# Patient Record
Sex: Female | Born: 1945
Health system: Southern US, Community
[De-identification: ages and names within clinical notes are randomized; demographics above are authoritative.]

## PROBLEM LIST (undated history)

## (undated) DIAGNOSIS — I10 Essential (primary) hypertension: Secondary | ICD-10-CM

## (undated) HISTORY — DX: Essential (primary) hypertension: I10

---

## 2004-09-23 ENCOUNTER — Ambulatory Visit: Payer: Self-pay | Admitting: Cardiology

## 2004-09-26 ENCOUNTER — Ambulatory Visit: Payer: Self-pay | Admitting: Cardiology

## 2004-10-13 ENCOUNTER — Ambulatory Visit: Payer: Self-pay | Admitting: Cardiology

## 2010-02-07 ENCOUNTER — Encounter
Admission: RE | Admit: 2010-02-07 | Discharge: 2010-02-07 | Payer: Self-pay | Source: Home / Self Care | Attending: Allergy | Admitting: Allergy

## 2012-07-06 IMAGING — CT CT PARANASAL SINUSES LIMITED
1 series · 10 of 12 positions shown, 13 images · non-contrast
Comparison: None

CLINICAL DATA: Cough and wheezing.  Chronic sinusitis.

CT PARANASAL SINUS LIMITED WITHOUT CONTRAST
TECHNIQUE: Multidetector CT images of the paranasal sinuses were
obtained in a single plane without contrast.

[Series 3: coronal soft · axial · 0.38mm/px · z∈[+62,+152]mm · 10 of 12 slices shown, 13 images]
[im 2/12  brain]
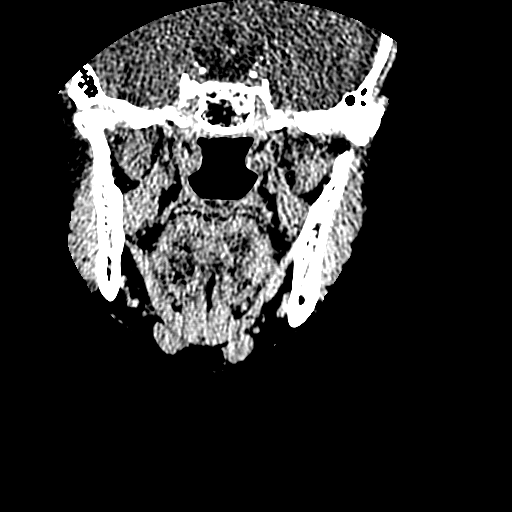
[im 2/12  bone]
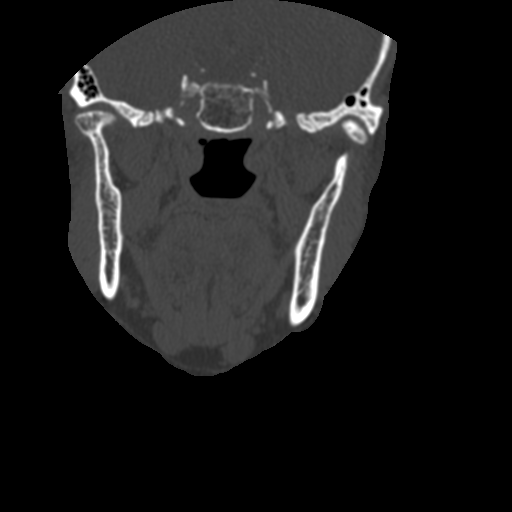
[im 3/12  bone]
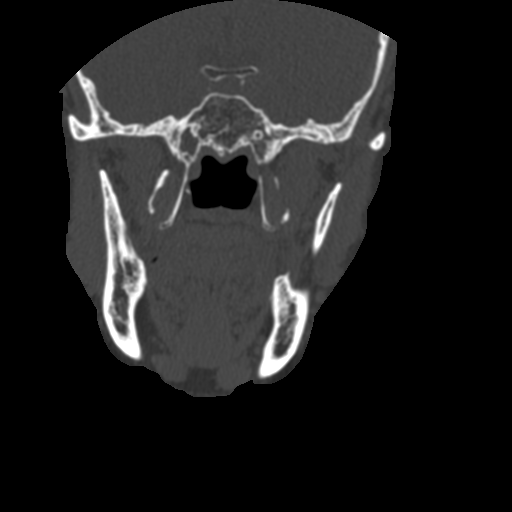
[im 4/12  bone]
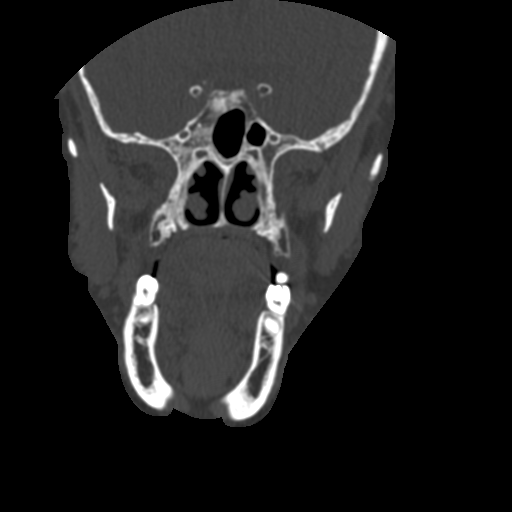
[im 5/12  bone]
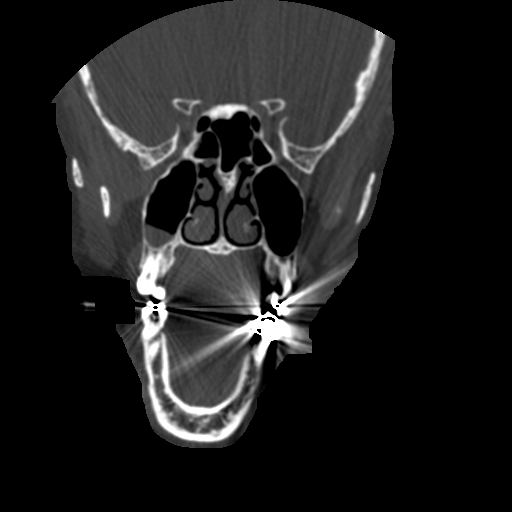
[im 6/12  brain]
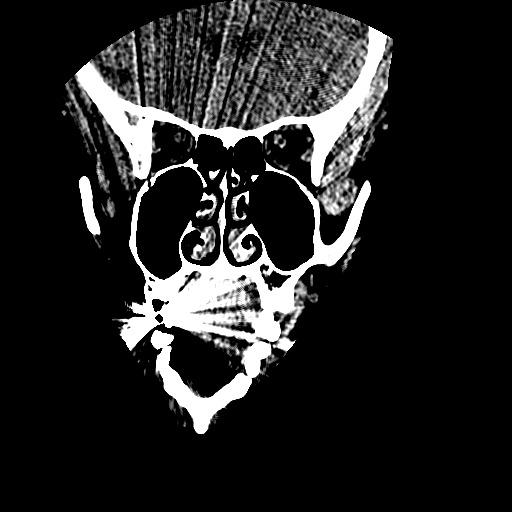
[im 6/12  bone]
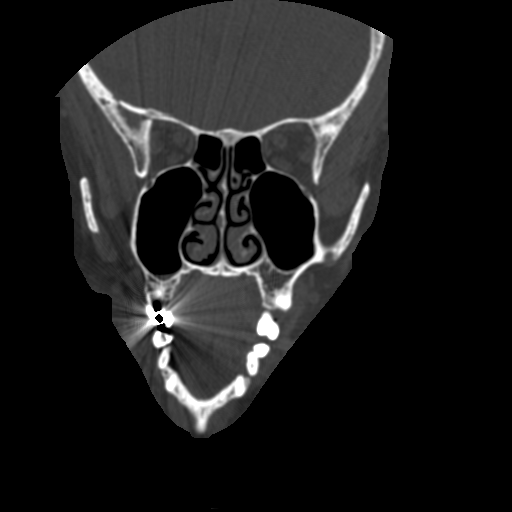
[im 7/12  bone]
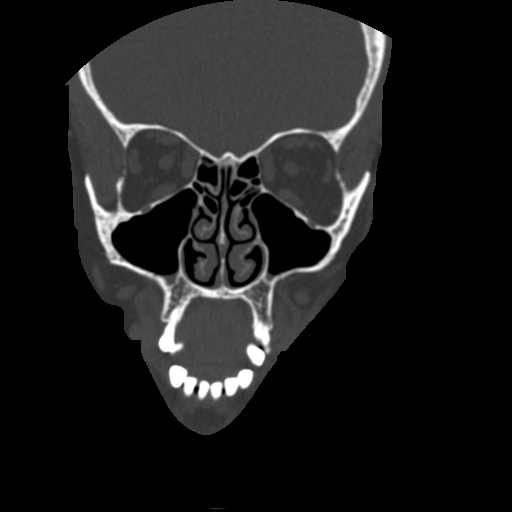
[im 8/12  bone]
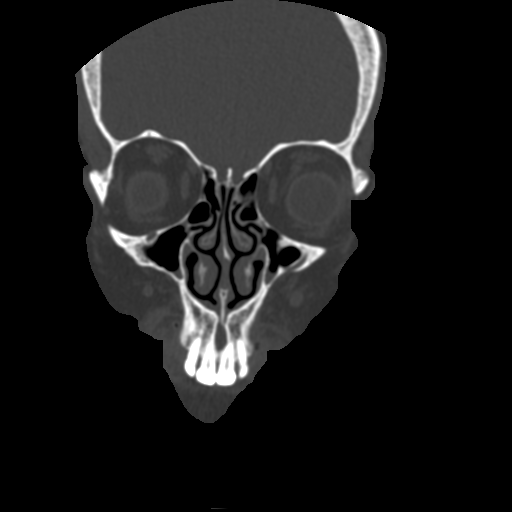
[im 9/12  bone]
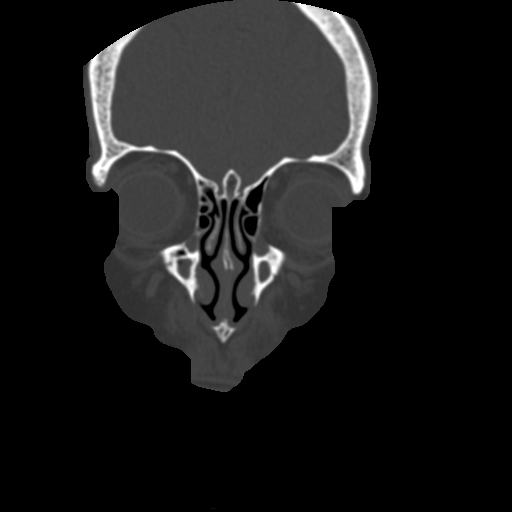
[im 10/12  brain]
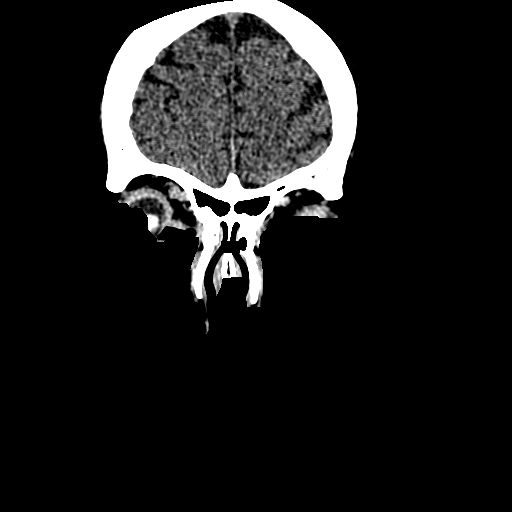
[im 10/12  bone]
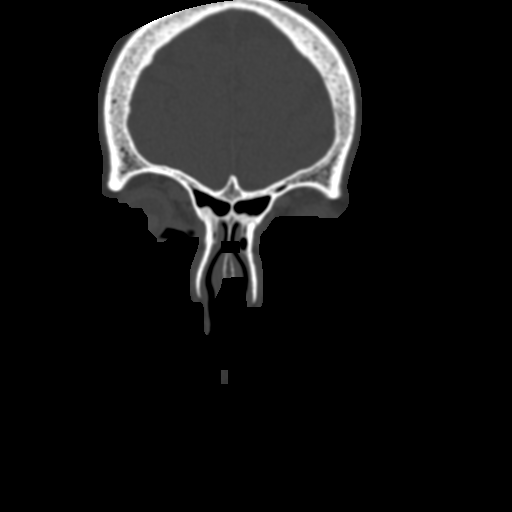
[im 11/12  bone]
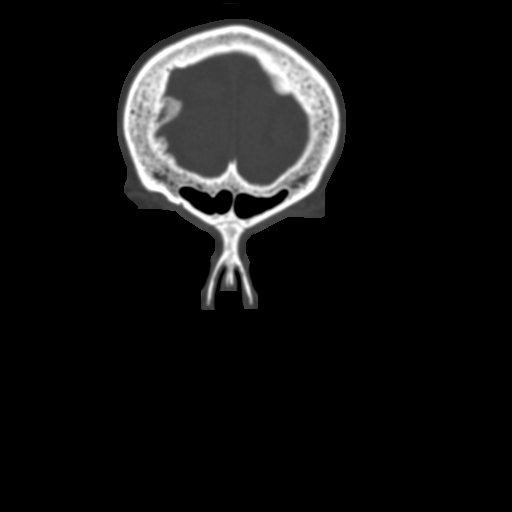

[10 of 12 positions shown; findings below may reference images not displayed]

FINDINGS: There is very mild mucosal thickening involving the
dependent portion of the maxillary sinuses.  This measures up to
4.4 mm.

The sphenoid sinuses, frontal sinuses and ethmoid air cells are
well-aerated.

The nasal septum is midline.
IMPRESSION: 1.  Very mild mucosal thickening involving the maxillary sinuses.

## 2015-02-07 DIAGNOSIS — E042 Nontoxic multinodular goiter: Secondary | ICD-10-CM | POA: Diagnosis not present

## 2015-04-01 DIAGNOSIS — E042 Nontoxic multinodular goiter: Secondary | ICD-10-CM | POA: Diagnosis not present

## 2015-04-05 DIAGNOSIS — Z1231 Encounter for screening mammogram for malignant neoplasm of breast: Secondary | ICD-10-CM | POA: Diagnosis not present

## 2015-10-10 DIAGNOSIS — N390 Urinary tract infection, site not specified: Secondary | ICD-10-CM | POA: Diagnosis not present

## 2015-10-10 DIAGNOSIS — R350 Frequency of micturition: Secondary | ICD-10-CM | POA: Diagnosis not present

## 2015-10-10 DIAGNOSIS — Z90710 Acquired absence of both cervix and uterus: Secondary | ICD-10-CM | POA: Diagnosis not present

## 2015-10-31 DIAGNOSIS — Z87891 Personal history of nicotine dependence: Secondary | ICD-10-CM | POA: Diagnosis not present

## 2015-10-31 DIAGNOSIS — I10 Essential (primary) hypertension: Secondary | ICD-10-CM | POA: Diagnosis not present

## 2015-10-31 DIAGNOSIS — N952 Postmenopausal atrophic vaginitis: Secondary | ICD-10-CM | POA: Diagnosis not present

## 2015-10-31 DIAGNOSIS — Z299 Encounter for prophylactic measures, unspecified: Secondary | ICD-10-CM | POA: Diagnosis not present

## 2015-11-11 DIAGNOSIS — J3089 Other allergic rhinitis: Secondary | ICD-10-CM | POA: Diagnosis not present

## 2015-11-18 DIAGNOSIS — Z6823 Body mass index (BMI) 23.0-23.9, adult: Secondary | ICD-10-CM | POA: Diagnosis not present

## 2015-11-18 DIAGNOSIS — Z713 Dietary counseling and surveillance: Secondary | ICD-10-CM | POA: Diagnosis not present

## 2015-11-18 DIAGNOSIS — Z299 Encounter for prophylactic measures, unspecified: Secondary | ICD-10-CM | POA: Diagnosis not present

## 2015-11-18 DIAGNOSIS — N952 Postmenopausal atrophic vaginitis: Secondary | ICD-10-CM | POA: Diagnosis not present

## 2015-12-18 DIAGNOSIS — J3089 Other allergic rhinitis: Secondary | ICD-10-CM | POA: Diagnosis not present

## 2015-12-18 DIAGNOSIS — J019 Acute sinusitis, unspecified: Secondary | ICD-10-CM | POA: Diagnosis not present

## 2016-01-21 DIAGNOSIS — Z7189 Other specified counseling: Secondary | ICD-10-CM | POA: Diagnosis not present

## 2016-01-21 DIAGNOSIS — Z6825 Body mass index (BMI) 25.0-25.9, adult: Secondary | ICD-10-CM | POA: Diagnosis not present

## 2016-01-21 DIAGNOSIS — Z1211 Encounter for screening for malignant neoplasm of colon: Secondary | ICD-10-CM | POA: Diagnosis not present

## 2016-01-21 DIAGNOSIS — Z299 Encounter for prophylactic measures, unspecified: Secondary | ICD-10-CM | POA: Diagnosis not present

## 2016-01-21 DIAGNOSIS — Z0000 Encounter for general adult medical examination without abnormal findings: Secondary | ICD-10-CM | POA: Diagnosis not present

## 2016-01-21 DIAGNOSIS — Z1389 Encounter for screening for other disorder: Secondary | ICD-10-CM | POA: Diagnosis not present

## 2016-01-22 DIAGNOSIS — F411 Generalized anxiety disorder: Secondary | ICD-10-CM | POA: Diagnosis not present

## 2016-01-22 DIAGNOSIS — E559 Vitamin D deficiency, unspecified: Secondary | ICD-10-CM | POA: Diagnosis not present

## 2016-01-22 DIAGNOSIS — Z79899 Other long term (current) drug therapy: Secondary | ICD-10-CM | POA: Diagnosis not present

## 2016-01-22 DIAGNOSIS — E7800 Pure hypercholesterolemia, unspecified: Secondary | ICD-10-CM | POA: Diagnosis not present

## 2016-03-12 DIAGNOSIS — E2839 Other primary ovarian failure: Secondary | ICD-10-CM | POA: Diagnosis not present

## 2016-04-07 DIAGNOSIS — Z1231 Encounter for screening mammogram for malignant neoplasm of breast: Secondary | ICD-10-CM | POA: Diagnosis not present

## 2016-05-05 DIAGNOSIS — E042 Nontoxic multinodular goiter: Secondary | ICD-10-CM | POA: Diagnosis not present

## 2016-05-20 DIAGNOSIS — Z6824 Body mass index (BMI) 24.0-24.9, adult: Secondary | ICD-10-CM | POA: Diagnosis not present

## 2016-05-20 DIAGNOSIS — Z87891 Personal history of nicotine dependence: Secondary | ICD-10-CM | POA: Diagnosis not present

## 2016-05-20 DIAGNOSIS — E785 Hyperlipidemia, unspecified: Secondary | ICD-10-CM | POA: Diagnosis not present

## 2016-05-20 DIAGNOSIS — I10 Essential (primary) hypertension: Secondary | ICD-10-CM | POA: Diagnosis not present

## 2016-05-20 DIAGNOSIS — Z299 Encounter for prophylactic measures, unspecified: Secondary | ICD-10-CM | POA: Diagnosis not present

## 2016-05-20 DIAGNOSIS — R109 Unspecified abdominal pain: Secondary | ICD-10-CM | POA: Diagnosis not present

## 2016-05-25 DIAGNOSIS — E042 Nontoxic multinodular goiter: Secondary | ICD-10-CM | POA: Diagnosis not present

## 2016-05-28 ENCOUNTER — Encounter (INDEPENDENT_AMBULATORY_CARE_PROVIDER_SITE_OTHER): Payer: Self-pay | Admitting: Internal Medicine

## 2016-05-28 ENCOUNTER — Encounter (INDEPENDENT_AMBULATORY_CARE_PROVIDER_SITE_OTHER): Payer: Self-pay

## 2016-06-10 ENCOUNTER — Encounter (INDEPENDENT_AMBULATORY_CARE_PROVIDER_SITE_OTHER): Payer: Self-pay | Admitting: *Deleted

## 2016-06-10 ENCOUNTER — Ambulatory Visit (INDEPENDENT_AMBULATORY_CARE_PROVIDER_SITE_OTHER): Payer: Medicare Other | Admitting: Internal Medicine

## 2016-06-10 ENCOUNTER — Encounter (INDEPENDENT_AMBULATORY_CARE_PROVIDER_SITE_OTHER): Payer: Self-pay | Admitting: Internal Medicine

## 2016-06-10 ENCOUNTER — Telehealth (INDEPENDENT_AMBULATORY_CARE_PROVIDER_SITE_OTHER): Payer: Self-pay | Admitting: *Deleted

## 2016-06-10 ENCOUNTER — Other Ambulatory Visit (INDEPENDENT_AMBULATORY_CARE_PROVIDER_SITE_OTHER): Payer: Self-pay | Admitting: Internal Medicine

## 2016-06-10 VITALS — BP 172/80 | HR 76 | Temp 98.1°F | Ht 64.5 in | Wt 142.1 lb

## 2016-06-10 DIAGNOSIS — R195 Other fecal abnormalities: Secondary | ICD-10-CM | POA: Diagnosis not present

## 2016-06-10 DIAGNOSIS — I10 Essential (primary) hypertension: Secondary | ICD-10-CM

## 2016-06-10 HISTORY — DX: Essential (primary) hypertension: I10

## 2016-06-10 MED ORDER — PEG 3350-KCL-NA BICARB-NACL 420 G PO SOLR: 4000.0000 mL | Freq: Once | ORAL | 0 refills | Status: AC

## 2016-06-10 NOTE — Progress Notes (Addendum)
   Subjective:    Patient ID: Ashley Sherman, female    DOB: 1945/04/19, 71 y.o.   MRN: [id number]  HPI Referred by Dr. Manuella Ghazi for colonoscopy. She tells me 3 weeks ago she had diarrhea. The diarrhea lasted about 10 days.  No fever associated with her symptoms. She says her stools were very soft. She had a tender area in her abdomen. She was given Cipro x 10 days.  She says her symptoms resolved.  She says she has a lot of mucous from her rectum. Her stools are loose.  Stools are not watery. She has 1-2 stools a day.  Weight has remained the same. Appetite is okay. Her last colonoscopy in [place] in 2014 by Dr. Posey Pronto and was normal.  She does have a hx of colon polyps.  Per Dr. Serita Grit records (adenoma)  Review of Systems Past Medical History:  Diagnosis Date  . Essential hypertension, benign 06/10/2016    No past surgical history on file.  Allergies  Allergen Reactions  . Penicillins     rash  . Sulfa Antibiotics     rash    No current outpatient prescriptions on file prior to visit.   No current facility-administered medications on file prior to visit.        Objective:   Physical Exam Blood pressure (!) 172/80, pulse 76, temperature 98.1 F (36.7 C), height 5' 4.5" (1.638 m), weight 142 lb 1.6 oz (64.5 kg).  Alert and oriented. Skin warm and dry. Oral mucosa is moist.   . Sclera anicteric, conjunctivae is pink. Thyroid not enlarged. No cervical lymphadenopathy. Lungs clear. Heart regular rate and rhythm.  Abdomen is soft. Bowel sounds are positive. No hepatomegaly. No abdominal masses felt. No tenderness.  No edema to lower extremities.  No stool, guaiac negative.       Assessment & Plan:  Change in stool. Colonic neoplasm needs to be ruled out. The risks and benefits such as perforation, bleeding, and infection were reviewed with the patient and is agreeable.

## 2016-06-10 NOTE — Patient Instructions (Signed)
Colonoscopy.  The risks and benefits such as perforation, bleeding, and infection were reviewed with the patient and is agreeable. 

## 2016-06-10 NOTE — Telephone Encounter (Signed)
Patient needs trilyte 

## 2016-06-12 ENCOUNTER — Encounter (INDEPENDENT_AMBULATORY_CARE_PROVIDER_SITE_OTHER): Payer: Self-pay

## 2016-06-17 ENCOUNTER — Ambulatory Visit (INDEPENDENT_AMBULATORY_CARE_PROVIDER_SITE_OTHER): Payer: Self-pay | Admitting: Internal Medicine

## 2016-07-14 DIAGNOSIS — K635 Polyp of colon: Secondary | ICD-10-CM | POA: Diagnosis not present

## 2016-07-14 DIAGNOSIS — E042 Nontoxic multinodular goiter: Secondary | ICD-10-CM | POA: Diagnosis not present

## 2016-07-29 DIAGNOSIS — M9903 Segmental and somatic dysfunction of lumbar region: Secondary | ICD-10-CM | POA: Diagnosis not present

## 2016-07-29 DIAGNOSIS — M791 Myalgia: Secondary | ICD-10-CM | POA: Diagnosis not present

## 2016-07-29 DIAGNOSIS — M9904 Segmental and somatic dysfunction of sacral region: Secondary | ICD-10-CM | POA: Diagnosis not present

## 2016-07-29 DIAGNOSIS — M546 Pain in thoracic spine: Secondary | ICD-10-CM | POA: Diagnosis not present

## 2016-07-29 DIAGNOSIS — M9902 Segmental and somatic dysfunction of thoracic region: Secondary | ICD-10-CM | POA: Diagnosis not present

## 2016-08-07 DIAGNOSIS — J019 Acute sinusitis, unspecified: Secondary | ICD-10-CM | POA: Diagnosis not present

## 2016-08-19 ENCOUNTER — Encounter (INDEPENDENT_AMBULATORY_CARE_PROVIDER_SITE_OTHER): Payer: Self-pay | Admitting: Internal Medicine

## 2016-08-19 ENCOUNTER — Encounter (INDEPENDENT_AMBULATORY_CARE_PROVIDER_SITE_OTHER): Payer: Self-pay

## 2016-08-19 ENCOUNTER — Ambulatory Visit (INDEPENDENT_AMBULATORY_CARE_PROVIDER_SITE_OTHER): Payer: Medicare Other | Admitting: Internal Medicine

## 2016-08-19 VITALS — BP 120/70 | HR 76 | Temp 98.6°F | Resp 18 | Ht 64.5 in | Wt 134.7 lb

## 2016-08-19 DIAGNOSIS — R195 Other fecal abnormalities: Secondary | ICD-10-CM

## 2016-08-19 NOTE — Progress Notes (Signed)
   Subjective:    Patient ID: Ashley Sherman, female    DOB: [birth year], 71 y.o.   MRN: [id number]  HPI  Scheduled for a colonoscopy in September. Last seen in May of this year. She is having a stool a couple times a day. Sometimes she still mucous. She has discomfort with her BMs. Stools are smaller.  There is no abdominal pain.  She lost about 8 pounds since her office visit. She has changed her diet.  Went to [place] in February of 2018.  No family hx of colon cancer.  Her last colonoscopy in [place] in 2014 by Dr. Posey Pronto and was normal.  She does have a hx of colon polyps.  Per Dr. Serita Grit records (adenoma)  Review of Systems Past Medical History:  Diagnosis Date  . Essential hypertension, benign 06/10/2016    No past surgical history on file.  Allergies  Allergen Reactions  . Penicillins     rash  . Sulfa Antibiotics     rash    Current Outpatient Prescriptions on File Prior to Visit  Medication Sig Dispense Refill  . amLODipine-benazepril (LOTREL) 5-10 MG capsule Take 1 capsule by mouth daily.     No current facility-administered medications on file prior to visit.         Objective:   Physical Exam Blood pressure 120/70, pulse 76, temperature 98.6 F (37 C), temperature source Oral, resp. rate 18, height 5' 4.5" (1.638 m), weight 134 lb 11.2 oz (61.1 kg).  Alert and oriented. Skin warm and dry. Oral mucosa is moist.   . Sclera anicteric, conjunctivae is pink. Thyroid not enlarged. No cervical lymphadenopathy. Lungs clear. Heart regular rate and rhythm.  Abdomen is soft. Bowel sounds are positive. No hepatomegaly. No abdominal masses felt. No tenderness.  No edema to lower extremities.          Assessment & Plan:  Change in stool. Will get a GI pathogen. Three stool cards home with patient.

## 2016-08-19 NOTE — Patient Instructions (Signed)
Scheduled for a colonoscopy

## 2016-08-31 DIAGNOSIS — N952 Postmenopausal atrophic vaginitis: Secondary | ICD-10-CM | POA: Diagnosis not present

## 2016-08-31 DIAGNOSIS — R634 Abnormal weight loss: Secondary | ICD-10-CM | POA: Diagnosis not present

## 2016-08-31 DIAGNOSIS — R109 Unspecified abdominal pain: Secondary | ICD-10-CM | POA: Diagnosis not present

## 2016-08-31 DIAGNOSIS — K649 Unspecified hemorrhoids: Secondary | ICD-10-CM | POA: Diagnosis not present

## 2016-08-31 DIAGNOSIS — E559 Vitamin D deficiency, unspecified: Secondary | ICD-10-CM | POA: Diagnosis not present

## 2016-08-31 DIAGNOSIS — R140 Abdominal distension (gaseous): Secondary | ICD-10-CM | POA: Diagnosis not present

## 2016-09-02 DIAGNOSIS — R197 Diarrhea, unspecified: Secondary | ICD-10-CM | POA: Diagnosis not present

## 2016-09-02 DIAGNOSIS — R634 Abnormal weight loss: Secondary | ICD-10-CM | POA: Diagnosis not present

## 2016-09-02 DIAGNOSIS — E559 Vitamin D deficiency, unspecified: Secondary | ICD-10-CM | POA: Diagnosis not present

## 2016-09-02 DIAGNOSIS — R109 Unspecified abdominal pain: Secondary | ICD-10-CM | POA: Diagnosis not present

## 2016-09-14 DIAGNOSIS — H43811 Vitreous degeneration, right eye: Secondary | ICD-10-CM | POA: Diagnosis not present

## 2016-09-14 DIAGNOSIS — H43391 Other vitreous opacities, right eye: Secondary | ICD-10-CM | POA: Diagnosis not present

## 2016-09-28 DIAGNOSIS — R634 Abnormal weight loss: Secondary | ICD-10-CM | POA: Diagnosis not present

## 2016-09-28 DIAGNOSIS — R140 Abdominal distension (gaseous): Secondary | ICD-10-CM | POA: Diagnosis not present

## 2016-09-28 DIAGNOSIS — R109 Unspecified abdominal pain: Secondary | ICD-10-CM | POA: Diagnosis not present

## 2016-09-30 DIAGNOSIS — R10813 Right lower quadrant abdominal tenderness: Secondary | ICD-10-CM | POA: Diagnosis not present

## 2016-09-30 DIAGNOSIS — R634 Abnormal weight loss: Secondary | ICD-10-CM | POA: Diagnosis not present

## 2016-09-30 DIAGNOSIS — N200 Calculus of kidney: Secondary | ICD-10-CM | POA: Diagnosis not present

## 2016-10-08 ENCOUNTER — Ambulatory Visit (HOSPITAL_COMMUNITY): Admission: RE | Admit: 2016-10-08 | Payer: Medicare Other | Source: Ambulatory Visit | Admitting: Internal Medicine

## 2016-10-08 ENCOUNTER — Encounter (HOSPITAL_COMMUNITY): Admission: RE | Payer: Self-pay | Source: Ambulatory Visit

## 2016-10-08 SURGERY — COLONOSCOPY
Anesthesia: Moderate Sedation

## 2016-10-14 DIAGNOSIS — E739 Lactose intolerance, unspecified: Secondary | ICD-10-CM | POA: Diagnosis not present

## 2016-10-14 DIAGNOSIS — R634 Abnormal weight loss: Secondary | ICD-10-CM | POA: Diagnosis not present

## 2016-10-14 DIAGNOSIS — R194 Change in bowel habit: Secondary | ICD-10-CM | POA: Diagnosis not present

## 2016-10-14 DIAGNOSIS — Z86010 Personal history of colonic polyps: Secondary | ICD-10-CM | POA: Diagnosis not present

## 2016-10-15 DIAGNOSIS — M9903 Segmental and somatic dysfunction of lumbar region: Secondary | ICD-10-CM | POA: Diagnosis not present

## 2016-10-15 DIAGNOSIS — M791 Myalgia: Secondary | ICD-10-CM | POA: Diagnosis not present

## 2016-10-15 DIAGNOSIS — M5441 Lumbago with sciatica, right side: Secondary | ICD-10-CM | POA: Diagnosis not present

## 2016-10-15 DIAGNOSIS — M9904 Segmental and somatic dysfunction of sacral region: Secondary | ICD-10-CM | POA: Diagnosis not present

## 2016-10-15 DIAGNOSIS — M9905 Segmental and somatic dysfunction of pelvic region: Secondary | ICD-10-CM | POA: Diagnosis not present

## 2016-10-22 DIAGNOSIS — M62830 Muscle spasm of back: Secondary | ICD-10-CM | POA: Diagnosis not present

## 2016-10-22 DIAGNOSIS — M545 Low back pain: Secondary | ICD-10-CM | POA: Diagnosis not present

## 2016-10-22 DIAGNOSIS — M5387 Other specified dorsopathies, lumbosacral region: Secondary | ICD-10-CM | POA: Diagnosis not present

## 2016-10-22 DIAGNOSIS — M9903 Segmental and somatic dysfunction of lumbar region: Secondary | ICD-10-CM | POA: Diagnosis not present

## 2016-10-22 DIAGNOSIS — M9904 Segmental and somatic dysfunction of sacral region: Secondary | ICD-10-CM | POA: Diagnosis not present

## 2016-10-22 DIAGNOSIS — M9902 Segmental and somatic dysfunction of thoracic region: Secondary | ICD-10-CM | POA: Diagnosis not present

## 2016-10-26 DIAGNOSIS — M545 Low back pain: Secondary | ICD-10-CM | POA: Diagnosis not present

## 2016-10-26 DIAGNOSIS — M9904 Segmental and somatic dysfunction of sacral region: Secondary | ICD-10-CM | POA: Diagnosis not present

## 2016-10-26 DIAGNOSIS — M9902 Segmental and somatic dysfunction of thoracic region: Secondary | ICD-10-CM | POA: Diagnosis not present

## 2016-10-26 DIAGNOSIS — M62830 Muscle spasm of back: Secondary | ICD-10-CM | POA: Diagnosis not present

## 2016-10-26 DIAGNOSIS — M5387 Other specified dorsopathies, lumbosacral region: Secondary | ICD-10-CM | POA: Diagnosis not present

## 2016-10-26 DIAGNOSIS — M9903 Segmental and somatic dysfunction of lumbar region: Secondary | ICD-10-CM | POA: Diagnosis not present

## 2016-10-28 DIAGNOSIS — M9903 Segmental and somatic dysfunction of lumbar region: Secondary | ICD-10-CM | POA: Diagnosis not present

## 2016-10-28 DIAGNOSIS — M5387 Other specified dorsopathies, lumbosacral region: Secondary | ICD-10-CM | POA: Diagnosis not present

## 2016-10-28 DIAGNOSIS — M545 Low back pain: Secondary | ICD-10-CM | POA: Diagnosis not present

## 2016-10-28 DIAGNOSIS — M62830 Muscle spasm of back: Secondary | ICD-10-CM | POA: Diagnosis not present

## 2016-10-28 DIAGNOSIS — M9902 Segmental and somatic dysfunction of thoracic region: Secondary | ICD-10-CM | POA: Diagnosis not present

## 2016-10-28 DIAGNOSIS — M9904 Segmental and somatic dysfunction of sacral region: Secondary | ICD-10-CM | POA: Diagnosis not present

## 2016-11-02 DIAGNOSIS — M5387 Other specified dorsopathies, lumbosacral region: Secondary | ICD-10-CM | POA: Diagnosis not present

## 2016-11-02 DIAGNOSIS — M62830 Muscle spasm of back: Secondary | ICD-10-CM | POA: Diagnosis not present

## 2016-11-02 DIAGNOSIS — M9904 Segmental and somatic dysfunction of sacral region: Secondary | ICD-10-CM | POA: Diagnosis not present

## 2016-11-02 DIAGNOSIS — M9903 Segmental and somatic dysfunction of lumbar region: Secondary | ICD-10-CM | POA: Diagnosis not present

## 2016-11-02 DIAGNOSIS — M9902 Segmental and somatic dysfunction of thoracic region: Secondary | ICD-10-CM | POA: Diagnosis not present

## 2016-11-02 DIAGNOSIS — M545 Low back pain: Secondary | ICD-10-CM | POA: Diagnosis not present

## 2016-11-04 DIAGNOSIS — M5387 Other specified dorsopathies, lumbosacral region: Secondary | ICD-10-CM | POA: Diagnosis not present

## 2016-11-04 DIAGNOSIS — M545 Low back pain: Secondary | ICD-10-CM | POA: Diagnosis not present

## 2016-11-04 DIAGNOSIS — M9902 Segmental and somatic dysfunction of thoracic region: Secondary | ICD-10-CM | POA: Diagnosis not present

## 2016-11-04 DIAGNOSIS — M9904 Segmental and somatic dysfunction of sacral region: Secondary | ICD-10-CM | POA: Diagnosis not present

## 2016-11-04 DIAGNOSIS — M62830 Muscle spasm of back: Secondary | ICD-10-CM | POA: Diagnosis not present

## 2016-11-04 DIAGNOSIS — M9903 Segmental and somatic dysfunction of lumbar region: Secondary | ICD-10-CM | POA: Diagnosis not present

## 2016-11-09 DIAGNOSIS — M9902 Segmental and somatic dysfunction of thoracic region: Secondary | ICD-10-CM | POA: Diagnosis not present

## 2016-11-09 DIAGNOSIS — M545 Low back pain: Secondary | ICD-10-CM | POA: Diagnosis not present

## 2016-11-09 DIAGNOSIS — M9903 Segmental and somatic dysfunction of lumbar region: Secondary | ICD-10-CM | POA: Diagnosis not present

## 2016-11-09 DIAGNOSIS — M9904 Segmental and somatic dysfunction of sacral region: Secondary | ICD-10-CM | POA: Diagnosis not present

## 2016-11-09 DIAGNOSIS — M5387 Other specified dorsopathies, lumbosacral region: Secondary | ICD-10-CM | POA: Diagnosis not present

## 2016-11-09 DIAGNOSIS — M62830 Muscle spasm of back: Secondary | ICD-10-CM | POA: Diagnosis not present

## 2016-11-13 DIAGNOSIS — J40 Bronchitis, not specified as acute or chronic: Secondary | ICD-10-CM | POA: Diagnosis not present

## 2016-11-19 DIAGNOSIS — M9902 Segmental and somatic dysfunction of thoracic region: Secondary | ICD-10-CM | POA: Diagnosis not present

## 2016-11-19 DIAGNOSIS — M9904 Segmental and somatic dysfunction of sacral region: Secondary | ICD-10-CM | POA: Diagnosis not present

## 2016-11-19 DIAGNOSIS — M5387 Other specified dorsopathies, lumbosacral region: Secondary | ICD-10-CM | POA: Diagnosis not present

## 2016-11-19 DIAGNOSIS — M545 Low back pain: Secondary | ICD-10-CM | POA: Diagnosis not present

## 2016-11-19 DIAGNOSIS — M9903 Segmental and somatic dysfunction of lumbar region: Secondary | ICD-10-CM | POA: Diagnosis not present

## 2016-11-19 DIAGNOSIS — M62830 Muscle spasm of back: Secondary | ICD-10-CM | POA: Diagnosis not present

## 2016-11-25 DIAGNOSIS — L905 Scar conditions and fibrosis of skin: Secondary | ICD-10-CM | POA: Diagnosis not present

## 2016-11-25 DIAGNOSIS — D2372 Other benign neoplasm of skin of left lower limb, including hip: Secondary | ICD-10-CM | POA: Diagnosis not present

## 2016-11-25 DIAGNOSIS — Z7189 Other specified counseling: Secondary | ICD-10-CM | POA: Diagnosis not present

## 2016-11-25 DIAGNOSIS — G950 Syringomyelia and syringobulbia: Secondary | ICD-10-CM | POA: Diagnosis not present

## 2016-12-01 DIAGNOSIS — M62830 Muscle spasm of back: Secondary | ICD-10-CM | POA: Diagnosis not present

## 2016-12-01 DIAGNOSIS — M545 Low back pain: Secondary | ICD-10-CM | POA: Diagnosis not present

## 2016-12-01 DIAGNOSIS — M9903 Segmental and somatic dysfunction of lumbar region: Secondary | ICD-10-CM | POA: Diagnosis not present

## 2016-12-01 DIAGNOSIS — M5387 Other specified dorsopathies, lumbosacral region: Secondary | ICD-10-CM | POA: Diagnosis not present

## 2016-12-01 DIAGNOSIS — M9904 Segmental and somatic dysfunction of sacral region: Secondary | ICD-10-CM | POA: Diagnosis not present

## 2016-12-01 DIAGNOSIS — M9902 Segmental and somatic dysfunction of thoracic region: Secondary | ICD-10-CM | POA: Diagnosis not present

## 2016-12-03 DIAGNOSIS — R194 Change in bowel habit: Secondary | ICD-10-CM | POA: Diagnosis not present

## 2016-12-03 DIAGNOSIS — Z86010 Personal history of colonic polyps: Secondary | ICD-10-CM | POA: Diagnosis not present

## 2016-12-03 DIAGNOSIS — K649 Unspecified hemorrhoids: Secondary | ICD-10-CM | POA: Diagnosis not present

## 2016-12-03 DIAGNOSIS — R634 Abnormal weight loss: Secondary | ICD-10-CM | POA: Diagnosis not present

## 2016-12-03 DIAGNOSIS — K6389 Other specified diseases of intestine: Secondary | ICD-10-CM | POA: Diagnosis not present

## 2016-12-14 DIAGNOSIS — M9904 Segmental and somatic dysfunction of sacral region: Secondary | ICD-10-CM | POA: Diagnosis not present

## 2016-12-14 DIAGNOSIS — R140 Abdominal distension (gaseous): Secondary | ICD-10-CM | POA: Diagnosis not present

## 2016-12-14 DIAGNOSIS — M9902 Segmental and somatic dysfunction of thoracic region: Secondary | ICD-10-CM | POA: Diagnosis not present

## 2016-12-14 DIAGNOSIS — M9903 Segmental and somatic dysfunction of lumbar region: Secondary | ICD-10-CM | POA: Diagnosis not present

## 2016-12-14 DIAGNOSIS — M545 Low back pain: Secondary | ICD-10-CM | POA: Diagnosis not present

## 2016-12-14 DIAGNOSIS — R109 Unspecified abdominal pain: Secondary | ICD-10-CM | POA: Diagnosis not present

## 2016-12-14 DIAGNOSIS — M5387 Other specified dorsopathies, lumbosacral region: Secondary | ICD-10-CM | POA: Diagnosis not present

## 2016-12-14 DIAGNOSIS — M62830 Muscle spasm of back: Secondary | ICD-10-CM | POA: Diagnosis not present

## 2016-12-14 DIAGNOSIS — R634 Abnormal weight loss: Secondary | ICD-10-CM | POA: Diagnosis not present

## 2016-12-17 DIAGNOSIS — M9902 Segmental and somatic dysfunction of thoracic region: Secondary | ICD-10-CM | POA: Diagnosis not present

## 2016-12-17 DIAGNOSIS — M545 Low back pain: Secondary | ICD-10-CM | POA: Diagnosis not present

## 2016-12-17 DIAGNOSIS — M9903 Segmental and somatic dysfunction of lumbar region: Secondary | ICD-10-CM | POA: Diagnosis not present

## 2016-12-17 DIAGNOSIS — M62830 Muscle spasm of back: Secondary | ICD-10-CM | POA: Diagnosis not present

## 2016-12-17 DIAGNOSIS — M5387 Other specified dorsopathies, lumbosacral region: Secondary | ICD-10-CM | POA: Diagnosis not present

## 2016-12-17 DIAGNOSIS — M9904 Segmental and somatic dysfunction of sacral region: Secondary | ICD-10-CM | POA: Diagnosis not present

## 2016-12-21 DIAGNOSIS — M545 Low back pain: Secondary | ICD-10-CM | POA: Diagnosis not present

## 2016-12-21 DIAGNOSIS — M62830 Muscle spasm of back: Secondary | ICD-10-CM | POA: Diagnosis not present

## 2016-12-21 DIAGNOSIS — M5387 Other specified dorsopathies, lumbosacral region: Secondary | ICD-10-CM | POA: Diagnosis not present

## 2016-12-21 DIAGNOSIS — M9903 Segmental and somatic dysfunction of lumbar region: Secondary | ICD-10-CM | POA: Diagnosis not present

## 2016-12-21 DIAGNOSIS — M9904 Segmental and somatic dysfunction of sacral region: Secondary | ICD-10-CM | POA: Diagnosis not present

## 2016-12-21 DIAGNOSIS — M9902 Segmental and somatic dysfunction of thoracic region: Secondary | ICD-10-CM | POA: Diagnosis not present

## 2016-12-24 DIAGNOSIS — L218 Other seborrheic dermatitis: Secondary | ICD-10-CM | POA: Diagnosis not present

## 2016-12-24 DIAGNOSIS — L821 Other seborrheic keratosis: Secondary | ICD-10-CM | POA: Diagnosis not present

## 2016-12-24 DIAGNOSIS — L814 Other melanin hyperpigmentation: Secondary | ICD-10-CM | POA: Diagnosis not present

## 2016-12-24 DIAGNOSIS — D2372 Other benign neoplasm of skin of left lower limb, including hip: Secondary | ICD-10-CM | POA: Diagnosis not present

## 2016-12-31 DIAGNOSIS — M9904 Segmental and somatic dysfunction of sacral region: Secondary | ICD-10-CM | POA: Diagnosis not present

## 2016-12-31 DIAGNOSIS — M9902 Segmental and somatic dysfunction of thoracic region: Secondary | ICD-10-CM | POA: Diagnosis not present

## 2016-12-31 DIAGNOSIS — M545 Low back pain: Secondary | ICD-10-CM | POA: Diagnosis not present

## 2016-12-31 DIAGNOSIS — M5387 Other specified dorsopathies, lumbosacral region: Secondary | ICD-10-CM | POA: Diagnosis not present

## 2016-12-31 DIAGNOSIS — M9903 Segmental and somatic dysfunction of lumbar region: Secondary | ICD-10-CM | POA: Diagnosis not present

## 2016-12-31 DIAGNOSIS — M62830 Muscle spasm of back: Secondary | ICD-10-CM | POA: Diagnosis not present

## 2017-01-06 DIAGNOSIS — M9902 Segmental and somatic dysfunction of thoracic region: Secondary | ICD-10-CM | POA: Diagnosis not present

## 2017-01-06 DIAGNOSIS — M545 Low back pain: Secondary | ICD-10-CM | POA: Diagnosis not present

## 2017-01-06 DIAGNOSIS — M9903 Segmental and somatic dysfunction of lumbar region: Secondary | ICD-10-CM | POA: Diagnosis not present

## 2017-01-06 DIAGNOSIS — M9904 Segmental and somatic dysfunction of sacral region: Secondary | ICD-10-CM | POA: Diagnosis not present

## 2017-01-06 DIAGNOSIS — M62830 Muscle spasm of back: Secondary | ICD-10-CM | POA: Diagnosis not present

## 2017-01-06 DIAGNOSIS — M5387 Other specified dorsopathies, lumbosacral region: Secondary | ICD-10-CM | POA: Diagnosis not present

## 2017-01-13 DIAGNOSIS — M9904 Segmental and somatic dysfunction of sacral region: Secondary | ICD-10-CM | POA: Diagnosis not present

## 2017-01-13 DIAGNOSIS — M9903 Segmental and somatic dysfunction of lumbar region: Secondary | ICD-10-CM | POA: Diagnosis not present

## 2017-01-13 DIAGNOSIS — M62830 Muscle spasm of back: Secondary | ICD-10-CM | POA: Diagnosis not present

## 2017-01-13 DIAGNOSIS — M545 Low back pain: Secondary | ICD-10-CM | POA: Diagnosis not present

## 2017-01-13 DIAGNOSIS — M9902 Segmental and somatic dysfunction of thoracic region: Secondary | ICD-10-CM | POA: Diagnosis not present

## 2017-01-13 DIAGNOSIS — M5387 Other specified dorsopathies, lumbosacral region: Secondary | ICD-10-CM | POA: Diagnosis not present

## 2017-01-22 DIAGNOSIS — M545 Low back pain: Secondary | ICD-10-CM | POA: Diagnosis not present

## 2017-01-28 DIAGNOSIS — E559 Vitamin D deficiency, unspecified: Secondary | ICD-10-CM | POA: Diagnosis not present

## 2017-01-28 DIAGNOSIS — R634 Abnormal weight loss: Secondary | ICD-10-CM | POA: Diagnosis not present

## 2017-01-28 DIAGNOSIS — M545 Low back pain: Secondary | ICD-10-CM | POA: Diagnosis not present

## 2017-02-01 DIAGNOSIS — E785 Hyperlipidemia, unspecified: Secondary | ICD-10-CM | POA: Diagnosis not present

## 2017-02-01 DIAGNOSIS — R5383 Other fatigue: Secondary | ICD-10-CM | POA: Diagnosis not present

## 2017-02-01 DIAGNOSIS — M545 Low back pain: Secondary | ICD-10-CM | POA: Diagnosis not present

## 2017-02-01 DIAGNOSIS — E569 Vitamin deficiency, unspecified: Secondary | ICD-10-CM | POA: Diagnosis not present

## 2017-02-01 DIAGNOSIS — E0965 Drug or chemical induced diabetes mellitus with hyperglycemia: Secondary | ICD-10-CM | POA: Diagnosis not present

## 2017-02-01 DIAGNOSIS — D509 Iron deficiency anemia, unspecified: Secondary | ICD-10-CM | POA: Diagnosis not present

## 2017-02-01 DIAGNOSIS — E782 Mixed hyperlipidemia: Secondary | ICD-10-CM | POA: Diagnosis not present

## 2017-02-01 DIAGNOSIS — D518 Other vitamin B12 deficiency anemias: Secondary | ICD-10-CM | POA: Diagnosis not present

## 2017-02-01 DIAGNOSIS — E559 Vitamin D deficiency, unspecified: Secondary | ICD-10-CM | POA: Diagnosis not present

## 2017-02-01 DIAGNOSIS — I2510 Atherosclerotic heart disease of native coronary artery without angina pectoris: Secondary | ICD-10-CM | POA: Diagnosis not present

## 2017-02-01 DIAGNOSIS — Z79899 Other long term (current) drug therapy: Secondary | ICD-10-CM | POA: Diagnosis not present

## 2017-02-01 DIAGNOSIS — R739 Hyperglycemia, unspecified: Secondary | ICD-10-CM | POA: Diagnosis not present

## 2017-02-01 DIAGNOSIS — R634 Abnormal weight loss: Secondary | ICD-10-CM | POA: Diagnosis not present

## 2017-02-17 DIAGNOSIS — R634 Abnormal weight loss: Secondary | ICD-10-CM | POA: Diagnosis not present

## 2017-02-17 DIAGNOSIS — M545 Low back pain: Secondary | ICD-10-CM | POA: Diagnosis not present

## 2017-02-17 DIAGNOSIS — N200 Calculus of kidney: Secondary | ICD-10-CM | POA: Diagnosis not present

## 2017-02-17 DIAGNOSIS — E018 Other iodine-deficiency related thyroid disorders and allied conditions: Secondary | ICD-10-CM | POA: Diagnosis not present

## 2017-02-17 DIAGNOSIS — M25559 Pain in unspecified hip: Secondary | ICD-10-CM | POA: Diagnosis not present

## 2017-02-17 DIAGNOSIS — E7211 Homocystinuria: Secondary | ICD-10-CM | POA: Diagnosis not present

## 2017-02-17 DIAGNOSIS — D649 Anemia, unspecified: Secondary | ICD-10-CM | POA: Diagnosis not present

## 2017-02-18 DIAGNOSIS — M47816 Spondylosis without myelopathy or radiculopathy, lumbar region: Secondary | ICD-10-CM | POA: Diagnosis not present

## 2017-02-18 DIAGNOSIS — M545 Low back pain: Secondary | ICD-10-CM | POA: Diagnosis not present

## 2017-02-18 DIAGNOSIS — M25559 Pain in unspecified hip: Secondary | ICD-10-CM | POA: Diagnosis not present

## 2017-02-18 DIAGNOSIS — M47814 Spondylosis without myelopathy or radiculopathy, thoracic region: Secondary | ICD-10-CM | POA: Diagnosis not present

## 2017-03-19 DIAGNOSIS — R638 Other symptoms and signs concerning food and fluid intake: Secondary | ICD-10-CM | POA: Diagnosis not present

## 2017-03-19 DIAGNOSIS — R634 Abnormal weight loss: Secondary | ICD-10-CM | POA: Diagnosis not present

## 2017-03-19 DIAGNOSIS — K588 Other irritable bowel syndrome: Secondary | ICD-10-CM | POA: Diagnosis not present

## 2017-03-19 DIAGNOSIS — M79606 Pain in leg, unspecified: Secondary | ICD-10-CM | POA: Diagnosis not present

## 2017-03-19 DIAGNOSIS — I10 Essential (primary) hypertension: Secondary | ICD-10-CM | POA: Diagnosis not present

## 2017-03-19 DIAGNOSIS — M79671 Pain in right foot: Secondary | ICD-10-CM | POA: Diagnosis not present

## 2017-03-19 DIAGNOSIS — M25551 Pain in right hip: Secondary | ICD-10-CM | POA: Diagnosis not present

## 2017-03-24 DIAGNOSIS — M47817 Spondylosis without myelopathy or radiculopathy, lumbosacral region: Secondary | ICD-10-CM | POA: Diagnosis not present

## 2017-03-24 DIAGNOSIS — M5135 Other intervertebral disc degeneration, thoracolumbar region: Secondary | ICD-10-CM | POA: Diagnosis not present

## 2017-03-24 DIAGNOSIS — M5136 Other intervertebral disc degeneration, lumbar region: Secondary | ICD-10-CM | POA: Diagnosis not present

## 2017-03-24 DIAGNOSIS — M7601 Gluteal tendinitis, right hip: Secondary | ICD-10-CM | POA: Diagnosis not present

## 2017-03-24 DIAGNOSIS — M25551 Pain in right hip: Secondary | ICD-10-CM | POA: Diagnosis not present

## 2017-04-02 DIAGNOSIS — M48062 Spinal stenosis, lumbar region with neurogenic claudication: Secondary | ICD-10-CM | POA: Diagnosis not present

## 2017-04-05 DIAGNOSIS — R0981 Nasal congestion: Secondary | ICD-10-CM | POA: Diagnosis not present

## 2017-04-05 DIAGNOSIS — J069 Acute upper respiratory infection, unspecified: Secondary | ICD-10-CM | POA: Diagnosis not present

## 2017-04-05 DIAGNOSIS — M48061 Spinal stenosis, lumbar region without neurogenic claudication: Secondary | ICD-10-CM | POA: Diagnosis not present

## 2017-05-03 DIAGNOSIS — Z1231 Encounter for screening mammogram for malignant neoplasm of breast: Secondary | ICD-10-CM | POA: Diagnosis not present

## 2017-05-14 DIAGNOSIS — M48062 Spinal stenosis, lumbar region with neurogenic claudication: Secondary | ICD-10-CM | POA: Diagnosis not present

## 2017-06-02 DIAGNOSIS — M48062 Spinal stenosis, lumbar region with neurogenic claudication: Secondary | ICD-10-CM | POA: Diagnosis not present

## 2017-06-10 DIAGNOSIS — M48062 Spinal stenosis, lumbar region with neurogenic claudication: Secondary | ICD-10-CM | POA: Diagnosis not present

## 2017-06-15 DIAGNOSIS — E042 Nontoxic multinodular goiter: Secondary | ICD-10-CM | POA: Diagnosis not present

## 2017-06-24 DIAGNOSIS — M48062 Spinal stenosis, lumbar region with neurogenic claudication: Secondary | ICD-10-CM | POA: Diagnosis not present

## 2017-06-29 DIAGNOSIS — M48062 Spinal stenosis, lumbar region with neurogenic claudication: Secondary | ICD-10-CM | POA: Diagnosis not present

## 2017-07-27 DIAGNOSIS — M48062 Spinal stenosis, lumbar region with neurogenic claudication: Secondary | ICD-10-CM | POA: Diagnosis not present

## 2017-08-13 DIAGNOSIS — M48062 Spinal stenosis, lumbar region with neurogenic claudication: Secondary | ICD-10-CM | POA: Diagnosis not present

## 2017-08-20 DIAGNOSIS — M48062 Spinal stenosis, lumbar region with neurogenic claudication: Secondary | ICD-10-CM | POA: Diagnosis not present

## 2017-09-30 DIAGNOSIS — M48062 Spinal stenosis, lumbar region with neurogenic claudication: Secondary | ICD-10-CM | POA: Diagnosis not present

## 2017-10-27 DIAGNOSIS — M48062 Spinal stenosis, lumbar region with neurogenic claudication: Secondary | ICD-10-CM | POA: Diagnosis not present

## 2017-11-15 DIAGNOSIS — M48062 Spinal stenosis, lumbar region with neurogenic claudication: Secondary | ICD-10-CM | POA: Diagnosis not present

## 2017-12-06 DIAGNOSIS — G894 Chronic pain syndrome: Secondary | ICD-10-CM | POA: Diagnosis not present

## 2017-12-06 DIAGNOSIS — R760 Raised antibody titer: Secondary | ICD-10-CM | POA: Diagnosis not present

## 2017-12-06 DIAGNOSIS — I2510 Atherosclerotic heart disease of native coronary artery without angina pectoris: Secondary | ICD-10-CM | POA: Diagnosis not present

## 2017-12-06 DIAGNOSIS — E039 Hypothyroidism, unspecified: Secondary | ICD-10-CM | POA: Diagnosis not present

## 2017-12-06 DIAGNOSIS — D841 Defects in the complement system: Secondary | ICD-10-CM | POA: Diagnosis not present

## 2017-12-06 DIAGNOSIS — R768 Other specified abnormal immunological findings in serum: Secondary | ICD-10-CM | POA: Diagnosis not present

## 2017-12-06 DIAGNOSIS — M797 Fibromyalgia: Secondary | ICD-10-CM | POA: Diagnosis not present

## 2017-12-06 DIAGNOSIS — N390 Urinary tract infection, site not specified: Secondary | ICD-10-CM | POA: Diagnosis not present

## 2017-12-06 DIAGNOSIS — D519 Vitamin B12 deficiency anemia, unspecified: Secondary | ICD-10-CM | POA: Diagnosis not present

## 2017-12-21 DIAGNOSIS — M48062 Spinal stenosis, lumbar region with neurogenic claudication: Secondary | ICD-10-CM | POA: Diagnosis not present

## 2018-02-09 DIAGNOSIS — M48062 Spinal stenosis, lumbar region with neurogenic claudication: Secondary | ICD-10-CM | POA: Diagnosis not present

## 2018-03-07 DIAGNOSIS — M48062 Spinal stenosis, lumbar region with neurogenic claudication: Secondary | ICD-10-CM | POA: Diagnosis not present

## 2018-03-19 DIAGNOSIS — K5900 Constipation, unspecified: Secondary | ICD-10-CM | POA: Diagnosis not present

## 2018-03-19 DIAGNOSIS — I10 Essential (primary) hypertension: Secondary | ICD-10-CM | POA: Diagnosis not present

## 2018-03-19 DIAGNOSIS — D500 Iron deficiency anemia secondary to blood loss (chronic): Secondary | ICD-10-CM | POA: Diagnosis not present

## 2018-03-19 DIAGNOSIS — K649 Unspecified hemorrhoids: Secondary | ICD-10-CM | POA: Diagnosis not present

## 2018-03-19 DIAGNOSIS — M48061 Spinal stenosis, lumbar region without neurogenic claudication: Secondary | ICD-10-CM | POA: Diagnosis not present

## 2018-03-31 DIAGNOSIS — K5900 Constipation, unspecified: Secondary | ICD-10-CM | POA: Diagnosis not present

## 2018-03-31 DIAGNOSIS — K649 Unspecified hemorrhoids: Secondary | ICD-10-CM | POA: Diagnosis not present

## 2018-03-31 DIAGNOSIS — E785 Hyperlipidemia, unspecified: Secondary | ICD-10-CM | POA: Diagnosis not present

## 2018-03-31 DIAGNOSIS — E0865 Diabetes mellitus due to underlying condition with hyperglycemia: Secondary | ICD-10-CM | POA: Diagnosis not present

## 2018-03-31 DIAGNOSIS — M48061 Spinal stenosis, lumbar region without neurogenic claudication: Secondary | ICD-10-CM | POA: Diagnosis not present

## 2018-03-31 DIAGNOSIS — D519 Vitamin B12 deficiency anemia, unspecified: Secondary | ICD-10-CM | POA: Diagnosis not present

## 2018-03-31 DIAGNOSIS — E559 Vitamin D deficiency, unspecified: Secondary | ICD-10-CM | POA: Diagnosis not present

## 2018-03-31 DIAGNOSIS — I10 Essential (primary) hypertension: Secondary | ICD-10-CM | POA: Diagnosis not present

## 2018-03-31 DIAGNOSIS — D500 Iron deficiency anemia secondary to blood loss (chronic): Secondary | ICD-10-CM | POA: Diagnosis not present

## 2018-04-20 DIAGNOSIS — K5900 Constipation, unspecified: Secondary | ICD-10-CM | POA: Diagnosis not present

## 2018-04-20 DIAGNOSIS — K649 Unspecified hemorrhoids: Secondary | ICD-10-CM | POA: Diagnosis not present

## 2018-04-20 DIAGNOSIS — D649 Anemia, unspecified: Secondary | ICD-10-CM | POA: Diagnosis not present

## 2018-04-20 DIAGNOSIS — M4800 Spinal stenosis, site unspecified: Secondary | ICD-10-CM | POA: Diagnosis not present

## 2018-04-20 DIAGNOSIS — E559 Vitamin D deficiency, unspecified: Secondary | ICD-10-CM | POA: Diagnosis not present

## 2018-04-20 DIAGNOSIS — E618 Deficiency of other specified nutrient elements: Secondary | ICD-10-CM | POA: Diagnosis not present

## 2018-04-20 DIAGNOSIS — R7309 Other abnormal glucose: Secondary | ICD-10-CM | POA: Diagnosis not present

## 2018-06-20 DIAGNOSIS — E042 Nontoxic multinodular goiter: Secondary | ICD-10-CM | POA: Diagnosis not present

## 2018-06-27 DIAGNOSIS — E042 Nontoxic multinodular goiter: Secondary | ICD-10-CM | POA: Diagnosis not present

## 2018-07-29 DIAGNOSIS — M4800 Spinal stenosis, site unspecified: Secondary | ICD-10-CM | POA: Diagnosis not present

## 2018-08-19 DIAGNOSIS — M5412 Radiculopathy, cervical region: Secondary | ICD-10-CM | POA: Diagnosis not present

## 2018-08-24 DIAGNOSIS — L814 Other melanin hyperpigmentation: Secondary | ICD-10-CM | POA: Diagnosis not present

## 2018-08-24 DIAGNOSIS — L570 Actinic keratosis: Secondary | ICD-10-CM | POA: Diagnosis not present

## 2018-08-24 DIAGNOSIS — L578 Other skin changes due to chronic exposure to nonionizing radiation: Secondary | ICD-10-CM | POA: Diagnosis not present

## 2018-08-24 DIAGNOSIS — D863 Sarcoidosis of skin: Secondary | ICD-10-CM | POA: Diagnosis not present

## 2018-08-26 DIAGNOSIS — M5412 Radiculopathy, cervical region: Secondary | ICD-10-CM | POA: Diagnosis not present

## 2018-08-26 DIAGNOSIS — M79601 Pain in right arm: Secondary | ICD-10-CM | POA: Diagnosis not present

## 2018-08-31 DIAGNOSIS — M5412 Radiculopathy, cervical region: Secondary | ICD-10-CM | POA: Diagnosis not present

## 2018-08-31 DIAGNOSIS — M79601 Pain in right arm: Secondary | ICD-10-CM | POA: Diagnosis not present

## 2018-09-07 DIAGNOSIS — M5412 Radiculopathy, cervical region: Secondary | ICD-10-CM | POA: Diagnosis not present

## 2018-09-07 DIAGNOSIS — M79601 Pain in right arm: Secondary | ICD-10-CM | POA: Diagnosis not present

## 2018-09-09 DIAGNOSIS — M48062 Spinal stenosis, lumbar region with neurogenic claudication: Secondary | ICD-10-CM | POA: Diagnosis not present

## 2018-09-09 DIAGNOSIS — M5412 Radiculopathy, cervical region: Secondary | ICD-10-CM | POA: Diagnosis not present

## 2018-09-14 DIAGNOSIS — M545 Low back pain: Secondary | ICD-10-CM | POA: Diagnosis not present

## 2018-09-14 DIAGNOSIS — M48062 Spinal stenosis, lumbar region with neurogenic claudication: Secondary | ICD-10-CM | POA: Diagnosis not present

## 2018-09-22 DIAGNOSIS — M5412 Radiculopathy, cervical region: Secondary | ICD-10-CM | POA: Diagnosis not present

## 2018-09-22 DIAGNOSIS — M79601 Pain in right arm: Secondary | ICD-10-CM | POA: Diagnosis not present

## 2018-09-22 DIAGNOSIS — M48062 Spinal stenosis, lumbar region with neurogenic claudication: Secondary | ICD-10-CM | POA: Diagnosis not present

## 2018-09-22 DIAGNOSIS — M545 Low back pain: Secondary | ICD-10-CM | POA: Diagnosis not present

## 2018-09-29 DIAGNOSIS — M545 Low back pain: Secondary | ICD-10-CM | POA: Diagnosis not present

## 2018-09-29 DIAGNOSIS — M5412 Radiculopathy, cervical region: Secondary | ICD-10-CM | POA: Diagnosis not present

## 2018-09-29 DIAGNOSIS — M48062 Spinal stenosis, lumbar region with neurogenic claudication: Secondary | ICD-10-CM | POA: Diagnosis not present

## 2018-09-29 DIAGNOSIS — M79601 Pain in right arm: Secondary | ICD-10-CM | POA: Diagnosis not present

## 2018-10-20 DIAGNOSIS — Z1231 Encounter for screening mammogram for malignant neoplasm of breast: Secondary | ICD-10-CM | POA: Diagnosis not present

## 2018-10-26 DIAGNOSIS — M545 Low back pain: Secondary | ICD-10-CM | POA: Diagnosis not present

## 2018-10-26 DIAGNOSIS — M48062 Spinal stenosis, lumbar region with neurogenic claudication: Secondary | ICD-10-CM | POA: Diagnosis not present

## 2018-10-31 DIAGNOSIS — Z23 Encounter for immunization: Secondary | ICD-10-CM | POA: Diagnosis not present

## 2018-10-31 DIAGNOSIS — E042 Nontoxic multinodular goiter: Secondary | ICD-10-CM | POA: Diagnosis not present

## 2018-11-15 DIAGNOSIS — M48062 Spinal stenosis, lumbar region with neurogenic claudication: Secondary | ICD-10-CM | POA: Diagnosis not present

## 2018-11-15 DIAGNOSIS — M545 Low back pain: Secondary | ICD-10-CM | POA: Diagnosis not present
